# Patient Record
Sex: Male | Born: 1975 | Race: White | Hispanic: No | Marital: Married | Smoking: Never smoker
Health system: Southern US, Community
[De-identification: ages and names within clinical notes are randomized; demographics above are authoritative.]

---

## 2014-05-26 ENCOUNTER — Encounter (HOSPITAL_BASED_OUTPATIENT_CLINIC_OR_DEPARTMENT_OTHER): Payer: Self-pay | Admitting: Emergency Medicine

## 2014-05-26 ENCOUNTER — Emergency Department (HOSPITAL_BASED_OUTPATIENT_CLINIC_OR_DEPARTMENT_OTHER)
Admission: EM | Admit: 2014-05-26 | Discharge: 2014-05-26 | Disposition: A | Discharge status: Home / Self Care | Payer: Self-pay | Attending: Emergency Medicine | Admitting: Emergency Medicine

## 2014-05-26 DIAGNOSIS — IMO0002 Reserved for concepts with insufficient information to code with codable children: Secondary | ICD-10-CM | POA: Insufficient documentation

## 2014-05-26 DIAGNOSIS — L0291 Cutaneous abscess, unspecified: Secondary | ICD-10-CM

## 2014-05-26 DIAGNOSIS — L0390 Cellulitis, unspecified: Secondary | ICD-10-CM

## 2014-05-26 LAB — BASIC METABOLIC PANEL
ANION GAP: 11 (ref 5–15)
BUN: 13 mg/dL (ref 6–23)
CHLORIDE: 101 meq/L (ref 96–112)
CO2: 29 mEq/L (ref 19–32)
CREATININE: 1.1 mg/dL (ref 0.50–1.35)
Calcium: 9.7 mg/dL (ref 8.4–10.5)
GFR calc non Af Amer: 86 mL/min — ABNORMAL LOW (ref >90)
Glucose, Bld: 131 mg/dL — ABNORMAL HIGH (ref 70–99)
POTASSIUM: 4 meq/L (ref 3.7–5.3)
SODIUM: 141 meq/L (ref 137–147)

## 2014-05-26 LAB — CBC WITH DIFFERENTIAL/PLATELET
BASOS ABS: 0 10*3/uL (ref 0.0–0.1)
BASOS PCT: 0 % (ref 0–1)
Eosinophils Absolute: 0.2 10*3/uL (ref 0.0–0.7)
Eosinophils Relative: 3 % (ref 0–5)
HCT: 39.9 % (ref 39.0–52.0)
Hemoglobin: 13.7 g/dL (ref 13.0–17.0)
LYMPHS PCT: 29 % (ref 12–46)
Lymphs Abs: 2.4 10*3/uL (ref 0.7–4.0)
MCH: 29.7 pg (ref 26.0–34.0)
MCHC: 34.3 g/dL (ref 30.0–36.0)
MCV: 86.6 fL (ref 78.0–100.0)
Monocytes Absolute: 0.6 10*3/uL (ref 0.1–1.0)
Monocytes Relative: 7 % (ref 3–12)
NEUTROS ABS: 5.1 10*3/uL (ref 1.7–7.7)
NEUTROS PCT: 61 % (ref 43–77)
Platelets: 216 10*3/uL (ref 150–400)
RBC: 4.61 MIL/uL (ref 4.22–5.81)
RDW: 12.2 % (ref 11.5–15.5)
WBC: 8.3 10*3/uL (ref 4.0–10.5)

## 2014-05-26 MED ORDER — SODIUM CHLORIDE 0.9 % IV BOLUS (SEPSIS)
250.0000 mL | Freq: Once | INTRAVENOUS | Status: AC
  Administered 2014-05-26: 250 mL via INTRAVENOUS

## 2014-05-26 MED ORDER — SODIUM CHLORIDE 0.9 % IV SOLN: INTRAVENOUS | Status: DC

## 2014-05-26 MED ORDER — VANCOMYCIN HCL IN DEXTROSE 1-5 GM/200ML-% IV SOLN
1000.0000 mg | Freq: Once | INTRAVENOUS | Status: AC
  Administered 2014-05-26: 1000 mg via INTRAVENOUS
  Filled 2014-05-26: qty 200

## 2014-05-26 MED ORDER — DOXYCYCLINE HYCLATE 100 MG PO CAPS: 100.0000 mg | ORAL_CAPSULE | Freq: Two times a day (BID) | ORAL | Status: AC

## 2014-05-26 NOTE — ED Notes (Signed)
Pt has abscess on right forearm  Since Saturday. Pt had I&D at urgent care, sent for IV abx

## 2014-05-26 NOTE — Discharge Instructions (Signed)
Cellulitis Cellulitis is an infection of the skin and the tissue under the skin. The infected area is usually red and tender. This happens most often in the arms and lower legs. HOME CARE   Take your antibiotic medicine as told. Finish the medicine even if you start to feel better.  Keep the infected arm or leg raised (elevated).  Put a warm cloth on the area up to 4 times per day.  Only take medicines as told by your doctor.  Keep all doctor visits as told. GET HELP IF:  You see red streaks on the skin coming from the infected area.  Your red area gets bigger or turns a dark color.  Your bone or joint under the infected area is painful after the skin heals.  Your infection comes back in the same area or different area.  You have a puffy (swollen) bump in the infected area.  You have new symptoms.  You have a fever. GET HELP RIGHT AWAY IF:   You feel very sleepy.  You throw up (vomit) or have watery poop (diarrhea).  You feel sick and have muscle aches and pains. MAKE SURE YOU:   Understand these instructions.  Will watch your condition.  Will get help right away if you are not doing well or get worse. Document Released: 03/05/2008 Document Revised: 02/01/2014 Document Reviewed: 12/03/2011 [facility] Patient Information 2015 [place], [place]. This information is not intended to replace advice given to you by your health care provider. Make sure you discuss any questions you have with your health care provider.  Abscess An abscess (boil or furuncle) is an infected area on or under the skin. This area is filled with yellowish-white fluid (pus) and other material (debris). HOME CARE   Only take medicines as told by your doctor.  If you were given antibiotic medicine, take it as directed. Finish the medicine even if you start to feel better.  If gauze is used, follow your doctor's directions for changing the gauze.  To avoid spreading the infection:  Keep your  abscess covered with a bandage.  Wash your hands well.  Do not share personal care items, towels, or whirlpools with others.  Avoid skin contact with others.  Keep your skin and clothes clean around the abscess.  Keep all doctor visits as told. GET HELP RIGHT AWAY IF:   You have more pain, puffiness (swelling), or redness in the wound site.  You have more fluid or blood coming from the wound site.  You have muscle aches, chills, or you feel sick.  You have a fever. MAKE SURE YOU:   Understand these instructions.  Will watch your condition.  Will get help right away if you are not doing well or get worse. Document Released: 03/05/2008 Document Revised: 03/18/2012 Document Reviewed: 11/30/2011 [facility] Patient Information 2015 [place], [place]. This information is not intended to replace advice given to you by your health care provider. Make sure you discuss any questions you have with your health care provider. Take antibiotic starting tomorrow as directed. Take pain medicine as given to you by the urgent care. Elevate the arm is much as possible. Return for any newer worse symptoms. Should be improving over the next couple days. Work note provided.

## 2014-05-26 NOTE — ED Provider Notes (Signed)
CSN: [id number]     Arrival date & time 05/26/14  1738 History   This chart was scribed for Vanetta Mulders, MD by [place], ED Scribe. The patient was seen in [facility]. The patient's care was started at 7:24 PM.    Chief Complaint  Patient presents with  . Abscess      Patient is a 38 y.o. male presenting with abscess. The history is provided by the patient. No language interpreter was used.  Abscess Associated symptoms: fever   Associated symptoms: no headaches and no vomiting    HPI Comments: Gee Habig is a 38 y.o. male who presents to the Emergency Department complaining of abscess to proximal aspect of his right forearm onset Saturday that pt believes may be possible from coming in contact with poison ivy. Pt also complains of associated fever and chills. Pt was seen at the [facility] earlier this afternoon around 5:00PM where he had I&D performed. Pt denies nausea, vomiting or diarrhea.    History reviewed. No pertinent past medical history. History reviewed. No pertinent past surgical history. No family history on file. History  Substance Use Topics  . Smoking status: Never Smoker   . Smokeless tobacco: Not on file  . Alcohol Use: Yes     Comment: occ    Review of Systems  Constitutional: Positive for fever and chills.  HENT: Negative for rhinorrhea and sore throat.   Eyes: Negative for visual disturbance.  Respiratory: Negative for cough and shortness of breath.   Cardiovascular: Negative for chest pain.  Gastrointestinal: Negative for vomiting, abdominal pain, diarrhea and rectal pain.  Genitourinary: Negative for dysuria and hematuria.  Musculoskeletal: Negative for back pain and neck pain.  Skin: Positive for rash (abscess).  Neurological: Negative for numbness and headaches.  Hematological: Does not bruise/bleed easily.  Psychiatric/Behavioral: Negative for confusion.      Allergies  Review of patient's allergies indicates no known  allergies.  Home Medications   Prior to Admission medications   Medication Sig Start Date End Date Taking? Authorizing Provider  doxycycline (VIBRAMYCIN) 100 MG capsule Take 1 capsule (100 mg total) by mouth 2 (two) times daily. 05/26/14   Vanetta Mulders, MD   BP 135/91  Pulse 83  Temp(Src) 99 F (37.2 C) (Oral)  Resp 16  Ht  (1.803 m)  Wt 265 lb (120.203 kg)  BMI 36.98 kg/m2  SpO2 100% Physical Exam  Nursing note and vitals reviewed. Constitutional: He is oriented to person, place, and time. He appears well-developed and well-nourished. No distress.  HENT:  Head: Normocephalic and atraumatic.  Mouth/Throat: Oropharynx is clear and moist.  Eyes: Conjunctivae and EOM are normal. No scleral icterus.  Neck: Normal range of motion.  Cardiovascular: Normal rate, regular rhythm and normal heart sounds.   No murmur heard. Cap refill 2 seconds.  RP 2+  Pulmonary/Chest: Effort normal and breath sounds normal. No respiratory distress.  Abdominal: Soft. Bowel sounds are normal. There is no tenderness.  Musculoskeletal: Normal range of motion.  Neurological: He is alert and oriented to person, place, and time. No cranial nerve deficit. He exhibits normal muscle tone. Coordination normal.  Skin: There is erythema.  Area of redness- 11cm  Area of induration- 5cm Incision- 2cm    ED Course  Procedures (including critical care time) Labs Review Labs Reviewed  BASIC METABOLIC PANEL - Abnormal; Notable for the following:    Glucose, Bld 131 (*)    GFR calc non Af Amer 86 (*)  All other components within normal limits  CBC WITH DIFFERENTIAL    Results for orders placed during the hospital encounter of 05/26/14  CBC WITH DIFFERENTIAL      Result Value Ref Range   WBC 8.3  4.0 - 10.5 K/uL   RBC 4.61  4.22 - 5.81 MIL/uL   Hemoglobin 13.7  13.0 - 17.0 g/dL   HCT 40.9  81.1 - 91.4 %   MCV 86.6  78.0 - 100.0 fL   MCH 29.7  26.0 - 34.0 pg   MCHC 34.3  30.0 - 36.0 g/dL   RDW  78.2  95.6 - 21.3 %   Platelets 216  150 - 400 K/uL   Neutrophils Relative % 61  43 - 77 %   Neutro Abs 5.1  1.7 - 7.7 K/uL   Lymphocytes Relative 29  12 - 46 %   Lymphs Abs 2.4  0.7 - 4.0 K/uL   Monocytes Relative 7  3 - 12 %   Monocytes Absolute 0.6  0.1 - 1.0 K/uL   Eosinophils Relative 3  0 - 5 %   Eosinophils Absolute 0.2  0.0 - 0.7 K/uL   Basophils Relative 0  0 - 1 %   Basophils Absolute 0.0  0.0 - 0.1 K/uL  BASIC METABOLIC PANEL      Result Value Ref Range   Sodium 141  137 - 147 mEq/L   Potassium 4.0  3.7 - 5.3 mEq/L   Chloride 101  96 - 112 mEq/L   CO2 29  19 - 32 mEq/L   Glucose, Bld 131 (*) 70 - 99 mg/dL   BUN 13  6 - 23 mg/dL   Creatinine, Ser 0.86  0.50 - 1.35 mg/dL   Calcium 9.7  8.4 - 57.8 mg/dL   GFR calc non Af Amer 86 (*) >90 mL/min   GFR calc Af Amer >90  >90 mL/min   Anion gap 11  5 - 15   No results found.    Imaging Review No results found.   EKG Interpretation None     Medications  0.9 %  sodium chloride infusion (not administered)  sodium chloride 0.9 % bolus 250 mL (250 mLs Intravenous New Bag/Given 05/26/14 1953)  vancomycin (VANCOCIN) IVPB 1000 mg/200 mL premix (0 mg Intravenous Stopped 05/26/14 2123)    7:31 PM- Treatment plan was discussed with patient who verbalizes understanding and agrees.  MDM   Final diagnoses:  Cellulitis and abscess    Patient with cellulitis to the right elbow right proximal forearm. Patient had incision and drainage of an abscess skin abscess at an urgent care earlier today. Referred here for IV antibiotics. Will change patient from the clindamycin to doxycycline orally. Also patient given IV vancomycin here. Patient nontoxic no acute distress. No significant leukocytosis no significant electrolyte abnormalities. Cautions provided. Patient should have improvement over the next couple days.  I personally performed the services described in this documentation, which was scribed in my presence. The recorded  information has been reviewed and is accurate.     Vanetta Mulders, MD 05/26/14 2124

## 2017-01-13 ENCOUNTER — Emergency Department (HOSPITAL_COMMUNITY)
Admission: EM | Admit: 2017-01-13 | Discharge: 2017-01-14 | Disposition: A | Discharge status: Home / Self Care | Payer: Managed Care, Other (non HMO) | Attending: Emergency Medicine | Admitting: Emergency Medicine

## 2017-01-13 ENCOUNTER — Encounter (HOSPITAL_COMMUNITY): Payer: Self-pay | Admitting: Emergency Medicine

## 2017-01-13 DIAGNOSIS — Y92009 Unspecified place in unspecified non-institutional (private) residence as the place of occurrence of the external cause: Secondary | ICD-10-CM | POA: Insufficient documentation

## 2017-01-13 DIAGNOSIS — S76112A Strain of left quadriceps muscle, fascia and tendon, initial encounter: Secondary | ICD-10-CM | POA: Diagnosis not present

## 2017-01-13 DIAGNOSIS — Y999 Unspecified external cause status: Secondary | ICD-10-CM | POA: Insufficient documentation

## 2017-01-13 DIAGNOSIS — Z79899 Other long term (current) drug therapy: Secondary | ICD-10-CM | POA: Insufficient documentation

## 2017-01-13 DIAGNOSIS — S79922A Unspecified injury of left thigh, initial encounter: Secondary | ICD-10-CM | POA: Diagnosis present

## 2017-01-13 DIAGNOSIS — Y939 Activity, unspecified: Secondary | ICD-10-CM | POA: Insufficient documentation

## 2017-01-13 DIAGNOSIS — W1839XA Other fall on same level, initial encounter: Secondary | ICD-10-CM | POA: Diagnosis not present

## 2017-01-13 NOTE — ED Notes (Signed)
Bed: WG95 Expected date:  Expected time:  Means of arrival:  Comments: Left leg injury

## 2017-01-13 NOTE — ED Triage Notes (Signed)
Brought in by EMS from home with c/o left upper leg pain after his mechanical fall tonight.  Pt reported that he slipped in the garage and fell backwards--- did not hit head, no loss of consciousness.  Pt has had immediate severe pain to left upper leg after the fall, with limited movement, tenderness and swelling to the affected area--- was given Fentanyl 100 mcg IV and immobilized on scene.

## 2017-01-14 ENCOUNTER — Emergency Department (HOSPITAL_COMMUNITY): Payer: Managed Care, Other (non HMO)

## 2017-01-14 MED ORDER — OXYCODONE-ACETAMINOPHEN 5-325 MG PO TABS
2.0000 | ORAL_TABLET | Freq: Once | ORAL | Status: AC
  Administered 2017-01-14: 2 via ORAL
  Filled 2017-01-14: qty 2

## 2017-01-14 MED ORDER — MORPHINE SULFATE (PF) 4 MG/ML IV SOLN
4.0000 mg | Freq: Once | INTRAVENOUS | Status: AC
  Administered 2017-01-14: 4 mg via INTRAVENOUS
  Filled 2017-01-14: qty 1

## 2017-01-14 MED ORDER — DIAZEPAM 5 MG PO TABS: 5.0000 mg | ORAL_TABLET | Freq: Three times a day (TID) | ORAL | 0 refills | Status: AC | PRN

## 2017-01-14 MED ORDER — OXYCODONE-ACETAMINOPHEN 5-325 MG PO TABS: 1.0000 | ORAL_TABLET | Freq: Four times a day (QID) | ORAL | 0 refills | Status: AC | PRN

## 2017-01-14 NOTE — ED Provider Notes (Signed)
[facility]-EMERGENCY DEPT Provider Note   CSN: [id number] Arrival date & time: 01/13/17  2311     History   Chief Complaint Chief Complaint  Patient presents with  . Fall  . Leg Injury    HPI Nicholas Wilcox is a 41 y.o. male.  41 year old male who presents with left thigh pain. This evening the patient was walking in his house when he slipped and fell. He does not recall exactly how he injured his left leg but he had an immediate onset of severe pain in his left thigh. He did not strike his head or lose consciousness. He initially crawled to a recliner and was not going to come to the ED but his leg later fell off the recliner causing severe pain. His pain is worse with any kind of movement at the knee. He denies any other injuries from the fall. Pain was mildly improved after fentanyl by EMS but he states that this has worn off.   The history is provided by the patient.  Fall     History reviewed. No pertinent past medical history.  There are no active problems to display for this patient.   History reviewed. No pertinent surgical history.     Home Medications    Prior to Admission medications   Medication Sig Start Date End Date Taking? Authorizing Provider  doxycycline (VIBRAMYCIN) 100 MG capsule Take 1 capsule (100 mg total) by mouth 2 (two) times daily. 05/26/14   Vanetta Mulders, MD    Family History History reviewed. No pertinent family history.  Social History Social History  Substance Use Topics  . Smoking status: Never Smoker  . Smokeless tobacco: Never Used  . Alcohol use Yes     Comment: occ     Allergies   Patient has no known allergies.   Review of Systems Review of Systems  All other systems reviewed and are negative.    Physical Exam Updated Vital Signs BP 133/82 (BP Location: Left Arm)   Pulse 86   Temp 98.7 F (37.1 C) (Oral)   Resp 20   SpO2 96%   Physical Exam  Constitutional: He is oriented to person, place, and time. He appears  well-developed and well-nourished. No distress.  HENT:  Head: Normocephalic and atraumatic.  Eyes: Conjunctivae are normal.  Neck: Neck supple.  Cardiovascular: Normal rate, regular rhythm, normal heart sounds and intact distal pulses.   Pulmonary/Chest: Effort normal and breath sounds normal.  Musculoskeletal: He exhibits edema and tenderness.  Tenderness of left anterior mid-thigh to just above knee with irregularity at distal insertion of quadriceps tendon; no lower leg or ankle tenderness; mild tenderness of proximal dorsal L foot without deformity Unable to extend leg at knee against gravity  Neurological: He is alert and oriented to person, place, and time. No sensory deficit.  Skin: Skin is warm and dry.  Psychiatric: He has a normal mood and affect. Judgment normal.  Nursing note and vitals reviewed.    ED Treatments / Results  Labs (all labs ordered are listed, but only abnormal results are displayed) Labs Reviewed - No data to display  EKG  EKG Interpretation None       Radiology Dg Knee Complete 4 Views Left  Result Date: 01/14/2017 CLINICAL DATA:  Left upper leg pain after mechanical fall tonight. Slip and fall injury. No loss of consciousness. Limited movement, tenderness, and swelling to the left upper leg. EXAM: LEFT KNEE - COMPLETE 4+ VIEW COMPARISON:  None. FINDINGS: No evidence of  fracture, dislocation, or joint effusion. No evidence of arthropathy or other focal bone abnormality. Soft tissues are unremarkable. IMPRESSION: Negative. Electronically Signed   By: Burman Nieves M.D.   On: 01/14/2017 00:56   Dg Foot Complete Left  Result Date: 01/14/2017 CLINICAL DATA:  Left upper leg pain after mechanical fall tonight. Slip and fall injury. No loss of consciousness. Limited movement, tenderness, and swelling to the left upper leg. EXAM: LEFT FOOT - COMPLETE 3+ VIEW COMPARISON:  None. FINDINGS: There is no evidence of fracture or dislocation. There is no evidence  of arthropathy or other focal bone abnormality. Soft tissues are unremarkable. IMPRESSION: Negative. Electronically Signed   By: Burman Nieves M.D.   On: 01/14/2017 00:57   Dg Femur Min 2 Views Left  Result Date: 01/14/2017 CLINICAL DATA:  Left upper leg pain after mechanical fall tonight. Slip and fall injury. No loss of consciousness. Limited movement, tenderness, and swelling to the left upper leg. EXAM: LEFT FEMUR 2 VIEWS COMPARISON:  None. FINDINGS: There is no evidence of fracture or other focal bone lesions. Soft tissues are unremarkable. IMPRESSION: Negative. Electronically Signed   By: Burman Nieves M.D.   On: 01/14/2017 00:56    Procedures Procedures (including critical care time)  Medications Ordered in ED Medications  morphine 4 MG/ML injection 4 mg (4 mg Intravenous Given 01/14/17 0004)  oxyCODONE-acetaminophen (PERCOCET/ROXICET) 5-325 MG per tablet 2 tablet (2 tablets Oral Given 01/14/17 0129)     Initial Impression / Assessment and Plan / ED Course  I have reviewed the triage vital signs and the nursing notes.  Pertinent imaging results that were available during my care of the patient were reviewed by me and considered in my medical decision making (see chart for details).     Pt w/ L upper leg pain after fall at home. He was neurovascularly intact distally with tenderness of anterior mid thigh and pain at distal insertion of quadriceps tendon. Unable to extend leg at knee against gravity. Obtained plain films which are negative for fracture. I am suspicious of quadriceps tendon rupture. This patient in the immobilizer and instructed to be strict nonweightbearing with crutches until follow-up with orthopedics this week. Discussed supportive measures and pain control. reviewed return precautions and patient discharged in satisfactory condition.  Final Clinical Impressions(s) / ED Diagnoses   Final diagnoses:  None    New Prescriptions New Prescriptions   No  medications on file     Laurence Spates, MD 01/14/17 [id number]

## 2018-06-26 DIAGNOSIS — F4312 Post-traumatic stress disorder, chronic: Secondary | ICD-10-CM | POA: Diagnosis not present

## 2018-07-03 DIAGNOSIS — F4312 Post-traumatic stress disorder, chronic: Secondary | ICD-10-CM | POA: Diagnosis not present

## 2018-07-10 DIAGNOSIS — F4312 Post-traumatic stress disorder, chronic: Secondary | ICD-10-CM | POA: Diagnosis not present

## 2018-07-31 DIAGNOSIS — F4312 Post-traumatic stress disorder, chronic: Secondary | ICD-10-CM | POA: Diagnosis not present

## 2018-08-14 DIAGNOSIS — F4312 Post-traumatic stress disorder, chronic: Secondary | ICD-10-CM | POA: Diagnosis not present

## 2018-09-04 DIAGNOSIS — F4312 Post-traumatic stress disorder, chronic: Secondary | ICD-10-CM | POA: Diagnosis not present

## 2019-03-26 DIAGNOSIS — E7800 Pure hypercholesterolemia, unspecified: Secondary | ICD-10-CM | POA: Diagnosis not present

## 2019-03-26 DIAGNOSIS — Z0000 Encounter for general adult medical examination without abnormal findings: Secondary | ICD-10-CM | POA: Diagnosis not present

## 2019-06-23 DIAGNOSIS — Z23 Encounter for immunization: Secondary | ICD-10-CM | POA: Diagnosis not present

## 2020-04-20 DIAGNOSIS — Z0000 Encounter for general adult medical examination without abnormal findings: Secondary | ICD-10-CM | POA: Diagnosis not present

## 2020-04-20 DIAGNOSIS — E7800 Pure hypercholesterolemia, unspecified: Secondary | ICD-10-CM | POA: Diagnosis not present

## 2020-06-30 DIAGNOSIS — Z23 Encounter for immunization: Secondary | ICD-10-CM | POA: Diagnosis not present

## 2020-07-28 DIAGNOSIS — F419 Anxiety disorder, unspecified: Secondary | ICD-10-CM | POA: Diagnosis not present

## 2020-08-02 DIAGNOSIS — F419 Anxiety disorder, unspecified: Secondary | ICD-10-CM | POA: Diagnosis not present

## 2020-08-10 DIAGNOSIS — F419 Anxiety disorder, unspecified: Secondary | ICD-10-CM | POA: Diagnosis not present

## 2020-08-17 DIAGNOSIS — F419 Anxiety disorder, unspecified: Secondary | ICD-10-CM | POA: Diagnosis not present

## 2020-08-31 DIAGNOSIS — F419 Anxiety disorder, unspecified: Secondary | ICD-10-CM | POA: Diagnosis not present

## 2020-09-07 DIAGNOSIS — F419 Anxiety disorder, unspecified: Secondary | ICD-10-CM | POA: Diagnosis not present

## 2020-09-14 DIAGNOSIS — F419 Anxiety disorder, unspecified: Secondary | ICD-10-CM | POA: Diagnosis not present

## 2020-10-04 DIAGNOSIS — F419 Anxiety disorder, unspecified: Secondary | ICD-10-CM | POA: Diagnosis not present

## 2020-10-18 DIAGNOSIS — F419 Anxiety disorder, unspecified: Secondary | ICD-10-CM | POA: Diagnosis not present

## 2020-11-01 DIAGNOSIS — F419 Anxiety disorder, unspecified: Secondary | ICD-10-CM | POA: Diagnosis not present

## 2020-11-15 DIAGNOSIS — F419 Anxiety disorder, unspecified: Secondary | ICD-10-CM | POA: Diagnosis not present

## 2020-11-29 DIAGNOSIS — F419 Anxiety disorder, unspecified: Secondary | ICD-10-CM | POA: Diagnosis not present

## 2020-12-14 DIAGNOSIS — F419 Anxiety disorder, unspecified: Secondary | ICD-10-CM | POA: Diagnosis not present

## 2020-12-28 DIAGNOSIS — F419 Anxiety disorder, unspecified: Secondary | ICD-10-CM | POA: Diagnosis not present

## 2021-01-10 DIAGNOSIS — F419 Anxiety disorder, unspecified: Secondary | ICD-10-CM | POA: Diagnosis not present

## 2021-01-25 DIAGNOSIS — F419 Anxiety disorder, unspecified: Secondary | ICD-10-CM | POA: Diagnosis not present

## 2021-02-08 DIAGNOSIS — F419 Anxiety disorder, unspecified: Secondary | ICD-10-CM | POA: Diagnosis not present

## 2021-03-22 DIAGNOSIS — F419 Anxiety disorder, unspecified: Secondary | ICD-10-CM | POA: Diagnosis not present

## 2021-04-21 DIAGNOSIS — Z0000 Encounter for general adult medical examination without abnormal findings: Secondary | ICD-10-CM | POA: Diagnosis not present

## 2021-05-17 DIAGNOSIS — F419 Anxiety disorder, unspecified: Secondary | ICD-10-CM | POA: Diagnosis not present

## 2021-05-19 DIAGNOSIS — L501 Idiopathic urticaria: Secondary | ICD-10-CM | POA: Diagnosis not present

## 2021-05-22 DIAGNOSIS — J3089 Other allergic rhinitis: Secondary | ICD-10-CM | POA: Diagnosis not present

## 2021-05-22 DIAGNOSIS — T781XXD Other adverse food reactions, not elsewhere classified, subsequent encounter: Secondary | ICD-10-CM | POA: Diagnosis not present

## 2021-05-22 DIAGNOSIS — R0602 Shortness of breath: Secondary | ICD-10-CM | POA: Diagnosis not present

## 2021-05-22 DIAGNOSIS — J301 Allergic rhinitis due to pollen: Secondary | ICD-10-CM | POA: Diagnosis not present

## 2021-06-21 DIAGNOSIS — F419 Anxiety disorder, unspecified: Secondary | ICD-10-CM | POA: Diagnosis not present

## 2021-07-27 DIAGNOSIS — F419 Anxiety disorder, unspecified: Secondary | ICD-10-CM | POA: Diagnosis not present

## 2021-07-28 DIAGNOSIS — E7800 Pure hypercholesterolemia, unspecified: Secondary | ICD-10-CM | POA: Diagnosis not present

## 2021-08-16 DIAGNOSIS — F419 Anxiety disorder, unspecified: Secondary | ICD-10-CM | POA: Diagnosis not present

## 2021-09-13 DIAGNOSIS — F419 Anxiety disorder, unspecified: Secondary | ICD-10-CM | POA: Diagnosis not present

## 2021-10-11 DIAGNOSIS — F419 Anxiety disorder, unspecified: Secondary | ICD-10-CM | POA: Diagnosis not present

## 2021-11-15 DIAGNOSIS — F419 Anxiety disorder, unspecified: Secondary | ICD-10-CM | POA: Diagnosis not present

## 2021-12-19 DIAGNOSIS — J301 Allergic rhinitis due to pollen: Secondary | ICD-10-CM | POA: Diagnosis not present

## 2021-12-19 DIAGNOSIS — R0602 Shortness of breath: Secondary | ICD-10-CM | POA: Diagnosis not present

## 2021-12-19 DIAGNOSIS — T7800XD Anaphylactic reaction due to unspecified food, subsequent encounter: Secondary | ICD-10-CM | POA: Diagnosis not present

## 2021-12-19 DIAGNOSIS — J3089 Other allergic rhinitis: Secondary | ICD-10-CM | POA: Diagnosis not present

## 2021-12-20 DIAGNOSIS — F419 Anxiety disorder, unspecified: Secondary | ICD-10-CM | POA: Diagnosis not present

## 2022-01-24 ENCOUNTER — Ambulatory Visit
Admission: RE | Admit: 2022-01-24 | Discharge: 2022-01-24 | Disposition: A | Discharge status: Home / Self Care | Payer: BC Managed Care – PPO | Source: Ambulatory Visit | Attending: Family Medicine | Admitting: Family Medicine

## 2022-01-24 ENCOUNTER — Other Ambulatory Visit: Payer: Self-pay | Admitting: Family Medicine

## 2022-01-24 DIAGNOSIS — M19071 Primary osteoarthritis, right ankle and foot: Secondary | ICD-10-CM | POA: Diagnosis not present

## 2022-01-24 DIAGNOSIS — M79674 Pain in right toe(s): Secondary | ICD-10-CM

## 2022-01-24 DIAGNOSIS — M25561 Pain in right knee: Secondary | ICD-10-CM | POA: Diagnosis not present

## 2022-01-31 DIAGNOSIS — F419 Anxiety disorder, unspecified: Secondary | ICD-10-CM | POA: Diagnosis not present

## 2022-02-02 DIAGNOSIS — M25561 Pain in right knee: Secondary | ICD-10-CM | POA: Diagnosis not present

## 2022-02-07 DIAGNOSIS — M62551 Muscle wasting and atrophy, not elsewhere classified, right thigh: Secondary | ICD-10-CM | POA: Diagnosis not present

## 2022-02-07 DIAGNOSIS — R269 Unspecified abnormalities of gait and mobility: Secondary | ICD-10-CM | POA: Diagnosis not present

## 2022-02-07 DIAGNOSIS — M25561 Pain in right knee: Secondary | ICD-10-CM | POA: Diagnosis not present

## 2022-02-09 DIAGNOSIS — M25561 Pain in right knee: Secondary | ICD-10-CM | POA: Diagnosis not present

## 2022-02-09 DIAGNOSIS — R269 Unspecified abnormalities of gait and mobility: Secondary | ICD-10-CM | POA: Diagnosis not present

## 2022-02-09 DIAGNOSIS — M62551 Muscle wasting and atrophy, not elsewhere classified, right thigh: Secondary | ICD-10-CM | POA: Diagnosis not present

## 2022-02-14 DIAGNOSIS — M25561 Pain in right knee: Secondary | ICD-10-CM | POA: Diagnosis not present

## 2022-02-14 DIAGNOSIS — R269 Unspecified abnormalities of gait and mobility: Secondary | ICD-10-CM | POA: Diagnosis not present

## 2022-02-14 DIAGNOSIS — M62551 Muscle wasting and atrophy, not elsewhere classified, right thigh: Secondary | ICD-10-CM | POA: Diagnosis not present

## 2022-02-16 DIAGNOSIS — R269 Unspecified abnormalities of gait and mobility: Secondary | ICD-10-CM | POA: Diagnosis not present

## 2022-02-16 DIAGNOSIS — M25561 Pain in right knee: Secondary | ICD-10-CM | POA: Diagnosis not present

## 2022-02-16 DIAGNOSIS — M62551 Muscle wasting and atrophy, not elsewhere classified, right thigh: Secondary | ICD-10-CM | POA: Diagnosis not present

## 2022-02-21 DIAGNOSIS — M25561 Pain in right knee: Secondary | ICD-10-CM | POA: Diagnosis not present

## 2022-02-21 DIAGNOSIS — R269 Unspecified abnormalities of gait and mobility: Secondary | ICD-10-CM | POA: Diagnosis not present

## 2022-02-21 DIAGNOSIS — M62551 Muscle wasting and atrophy, not elsewhere classified, right thigh: Secondary | ICD-10-CM | POA: Diagnosis not present

## 2022-02-22 DIAGNOSIS — M25561 Pain in right knee: Secondary | ICD-10-CM | POA: Diagnosis not present

## 2022-02-22 DIAGNOSIS — M25521 Pain in right elbow: Secondary | ICD-10-CM | POA: Diagnosis not present

## 2022-03-14 DIAGNOSIS — F419 Anxiety disorder, unspecified: Secondary | ICD-10-CM | POA: Diagnosis not present

## 2022-04-25 DIAGNOSIS — F419 Anxiety disorder, unspecified: Secondary | ICD-10-CM | POA: Diagnosis not present

## 2022-06-06 DIAGNOSIS — F419 Anxiety disorder, unspecified: Secondary | ICD-10-CM | POA: Diagnosis not present

## 2022-07-06 DIAGNOSIS — R21 Rash and other nonspecific skin eruption: Secondary | ICD-10-CM | POA: Diagnosis not present

## 2022-07-06 DIAGNOSIS — R109 Unspecified abdominal pain: Secondary | ICD-10-CM | POA: Diagnosis not present

## 2022-07-06 DIAGNOSIS — Z0189 Encounter for other specified special examinations: Secondary | ICD-10-CM | POA: Diagnosis not present

## 2022-07-06 DIAGNOSIS — F1010 Alcohol abuse, uncomplicated: Secondary | ICD-10-CM | POA: Diagnosis not present

## 2022-07-06 DIAGNOSIS — E7800 Pure hypercholesterolemia, unspecified: Secondary | ICD-10-CM | POA: Diagnosis not present

## 2022-07-11 DIAGNOSIS — F419 Anxiety disorder, unspecified: Secondary | ICD-10-CM | POA: Diagnosis not present

## 2022-08-15 DIAGNOSIS — F419 Anxiety disorder, unspecified: Secondary | ICD-10-CM | POA: Diagnosis not present

## 2022-09-12 DIAGNOSIS — F419 Anxiety disorder, unspecified: Secondary | ICD-10-CM | POA: Diagnosis not present

## 2022-11-07 DIAGNOSIS — F419 Anxiety disorder, unspecified: Secondary | ICD-10-CM | POA: Diagnosis not present

## 2022-12-12 DIAGNOSIS — F419 Anxiety disorder, unspecified: Secondary | ICD-10-CM | POA: Diagnosis not present

## 2022-12-18 DIAGNOSIS — J3089 Other allergic rhinitis: Secondary | ICD-10-CM | POA: Diagnosis not present

## 2022-12-18 DIAGNOSIS — R0602 Shortness of breath: Secondary | ICD-10-CM | POA: Diagnosis not present

## 2022-12-18 DIAGNOSIS — T7800XD Anaphylactic reaction due to unspecified food, subsequent encounter: Secondary | ICD-10-CM | POA: Diagnosis not present

## 2022-12-18 DIAGNOSIS — J301 Allergic rhinitis due to pollen: Secondary | ICD-10-CM | POA: Diagnosis not present

## 2023-01-23 DIAGNOSIS — F419 Anxiety disorder, unspecified: Secondary | ICD-10-CM | POA: Diagnosis not present

## 2023-03-20 DIAGNOSIS — F419 Anxiety disorder, unspecified: Secondary | ICD-10-CM | POA: Diagnosis not present

## 2023-04-24 DIAGNOSIS — F419 Anxiety disorder, unspecified: Secondary | ICD-10-CM | POA: Diagnosis not present

## 2023-06-05 DIAGNOSIS — F419 Anxiety disorder, unspecified: Secondary | ICD-10-CM | POA: Diagnosis not present

## 2023-07-12 DIAGNOSIS — E7800 Pure hypercholesterolemia, unspecified: Secondary | ICD-10-CM | POA: Diagnosis not present

## 2023-07-12 DIAGNOSIS — Z0000 Encounter for general adult medical examination without abnormal findings: Secondary | ICD-10-CM | POA: Diagnosis not present

## 2023-07-12 DIAGNOSIS — R7401 Elevation of levels of liver transaminase levels: Secondary | ICD-10-CM | POA: Diagnosis not present

## 2023-07-16 ENCOUNTER — Other Ambulatory Visit: Payer: Self-pay | Admitting: Family Medicine

## 2023-07-16 DIAGNOSIS — R7401 Elevation of levels of liver transaminase levels: Secondary | ICD-10-CM

## 2023-07-17 DIAGNOSIS — F419 Anxiety disorder, unspecified: Secondary | ICD-10-CM | POA: Diagnosis not present

## 2023-07-23 ENCOUNTER — Inpatient Hospital Stay
Admission: RE | Admit: 2023-07-23 | Discharge: 2023-07-23 | Discharge status: Home / Self Care | Payer: BC Managed Care – PPO | Source: Ambulatory Visit | Attending: Family Medicine | Admitting: Family Medicine

## 2023-07-23 DIAGNOSIS — K769 Liver disease, unspecified: Secondary | ICD-10-CM | POA: Diagnosis not present

## 2023-07-23 DIAGNOSIS — R7401 Elevation of levels of liver transaminase levels: Secondary | ICD-10-CM

## 2023-08-21 DIAGNOSIS — F419 Anxiety disorder, unspecified: Secondary | ICD-10-CM | POA: Diagnosis not present

## 2023-10-09 DIAGNOSIS — F419 Anxiety disorder, unspecified: Secondary | ICD-10-CM | POA: Diagnosis not present

## 2023-12-04 DIAGNOSIS — F419 Anxiety disorder, unspecified: Secondary | ICD-10-CM | POA: Diagnosis not present

## 2023-12-18 DIAGNOSIS — R0602 Shortness of breath: Secondary | ICD-10-CM | POA: Diagnosis not present

## 2023-12-18 DIAGNOSIS — J301 Allergic rhinitis due to pollen: Secondary | ICD-10-CM | POA: Diagnosis not present

## 2023-12-18 DIAGNOSIS — J3089 Other allergic rhinitis: Secondary | ICD-10-CM | POA: Diagnosis not present

## 2023-12-18 DIAGNOSIS — T7800XD Anaphylactic reaction due to unspecified food, subsequent encounter: Secondary | ICD-10-CM | POA: Diagnosis not present

## 2023-12-30 DIAGNOSIS — L2089 Other atopic dermatitis: Secondary | ICD-10-CM | POA: Diagnosis not present

## 2024-01-22 DIAGNOSIS — F419 Anxiety disorder, unspecified: Secondary | ICD-10-CM | POA: Diagnosis not present

## 2024-02-21 IMAGING — DX DG KNEE 1-2V*R*
2 series · 2 of 2 positions shown · non-contrast
Comparison: None.

CLINICAL DATA: Right knee pain

EXAM:
RIGHT KNEE - 1-2 VIEW

[dg knee 1-2 views right (1 of 2)]
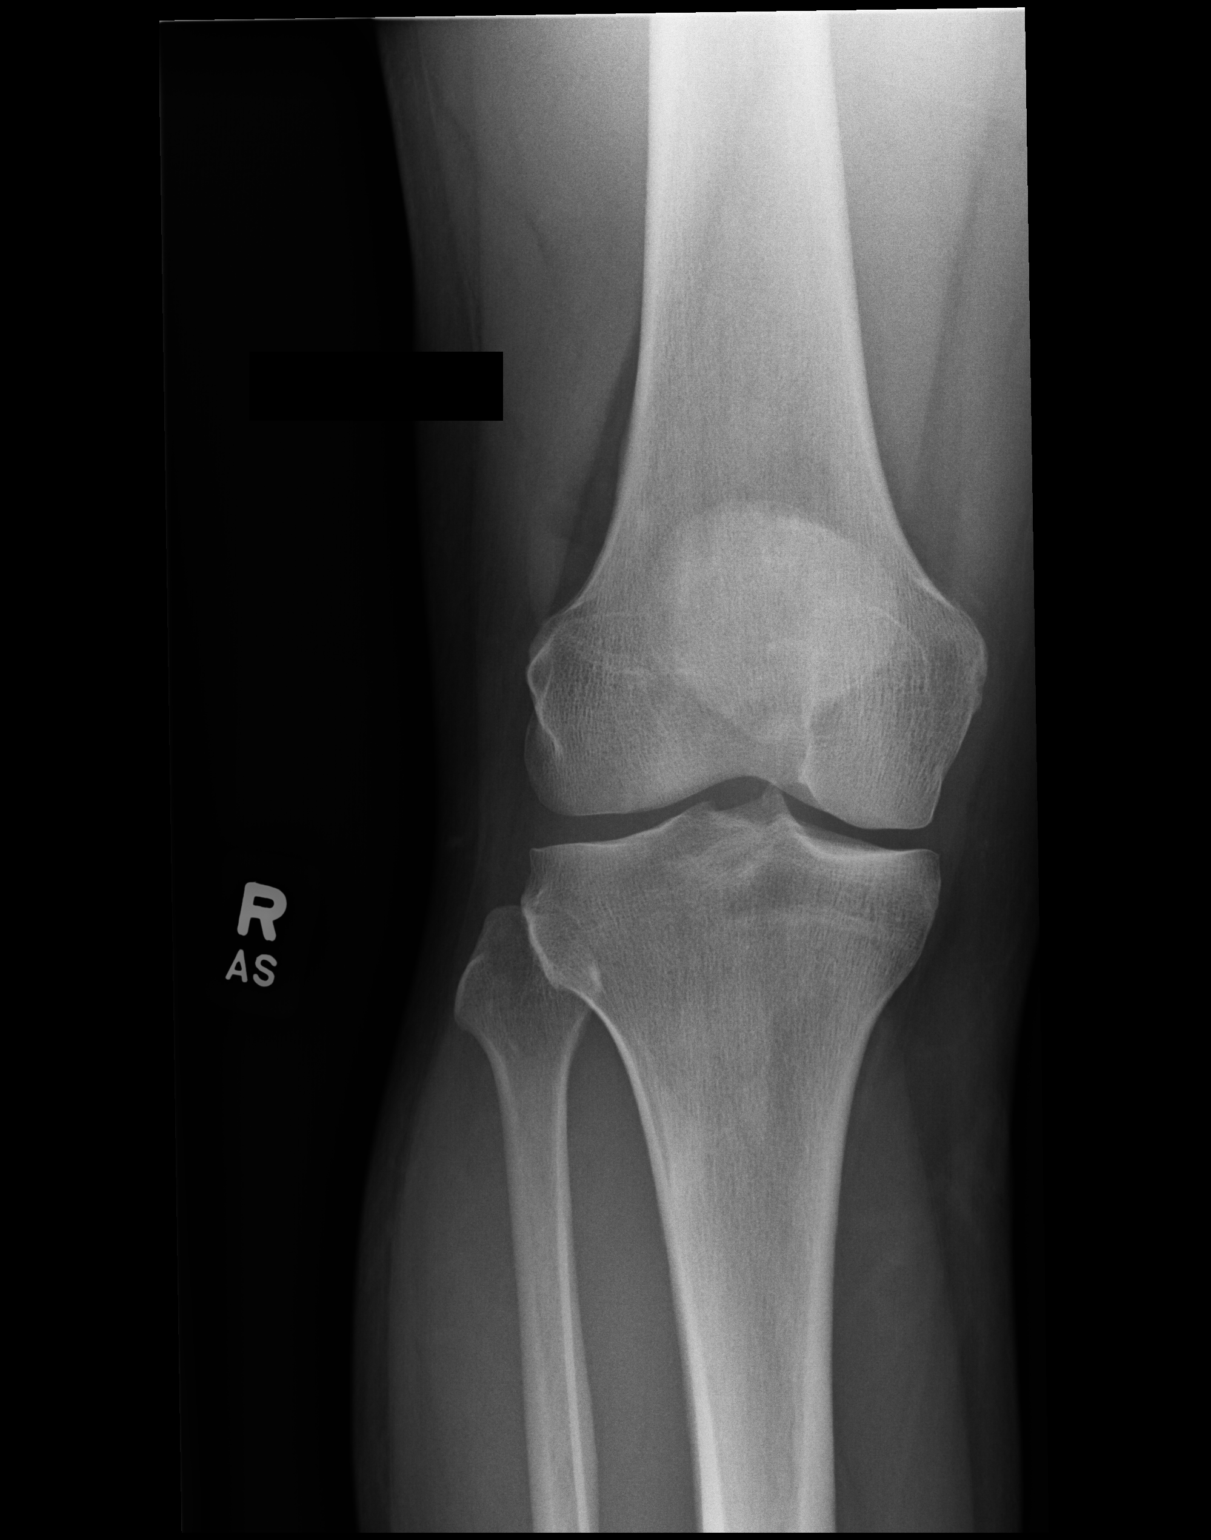

[dg knee 1-2 views right (2 of 2)]
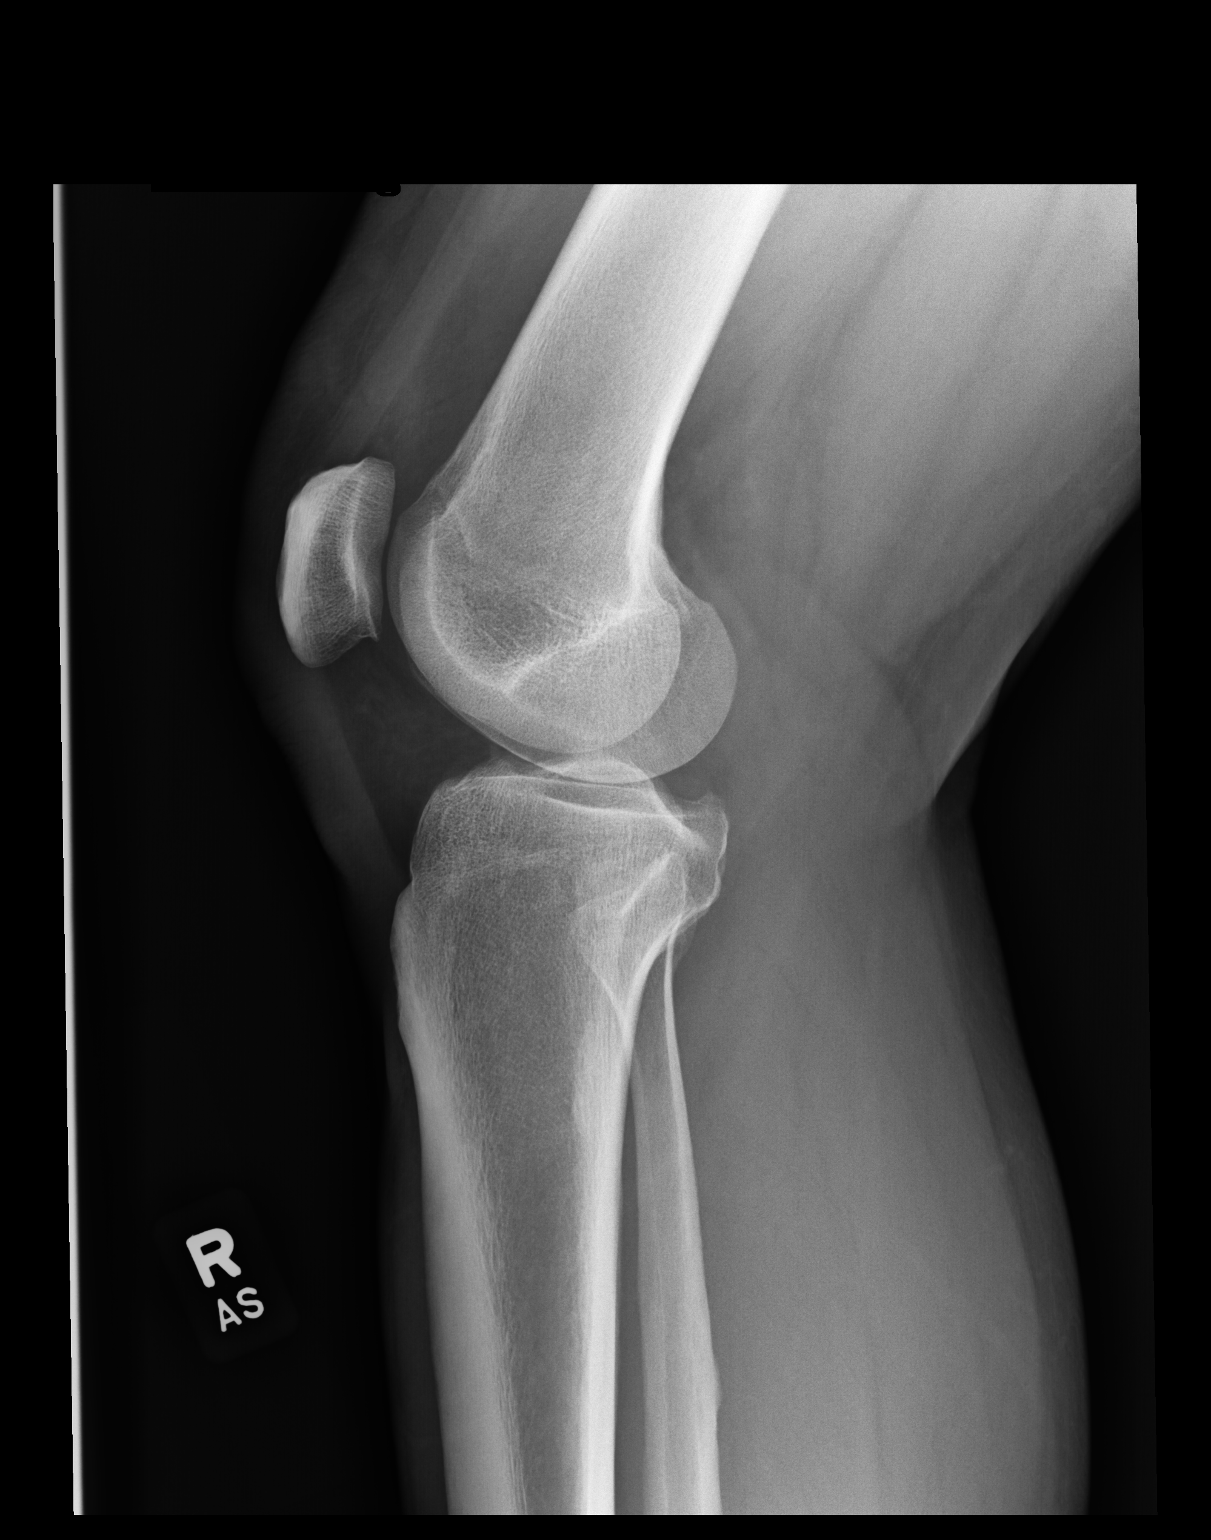

[2 of 2 positions shown; findings below may reference images not displayed]

FINDINGS: No acute fracture or dislocation identified. Joint spaces are
preserved. No joint effusion identified.
IMPRESSION: No acute osseous abnormality identified.

## 2024-02-21 IMAGING — DX DG TOE GREAT 2+V*R*
3 series · 3 of 3 positions shown · non-contrast
Comparison: None available

CLINICAL DATA: Right great toe pain.  No recent injury.

EXAM:
RIGHT GREAT TOE

[dg toe great right (1 of 3)]
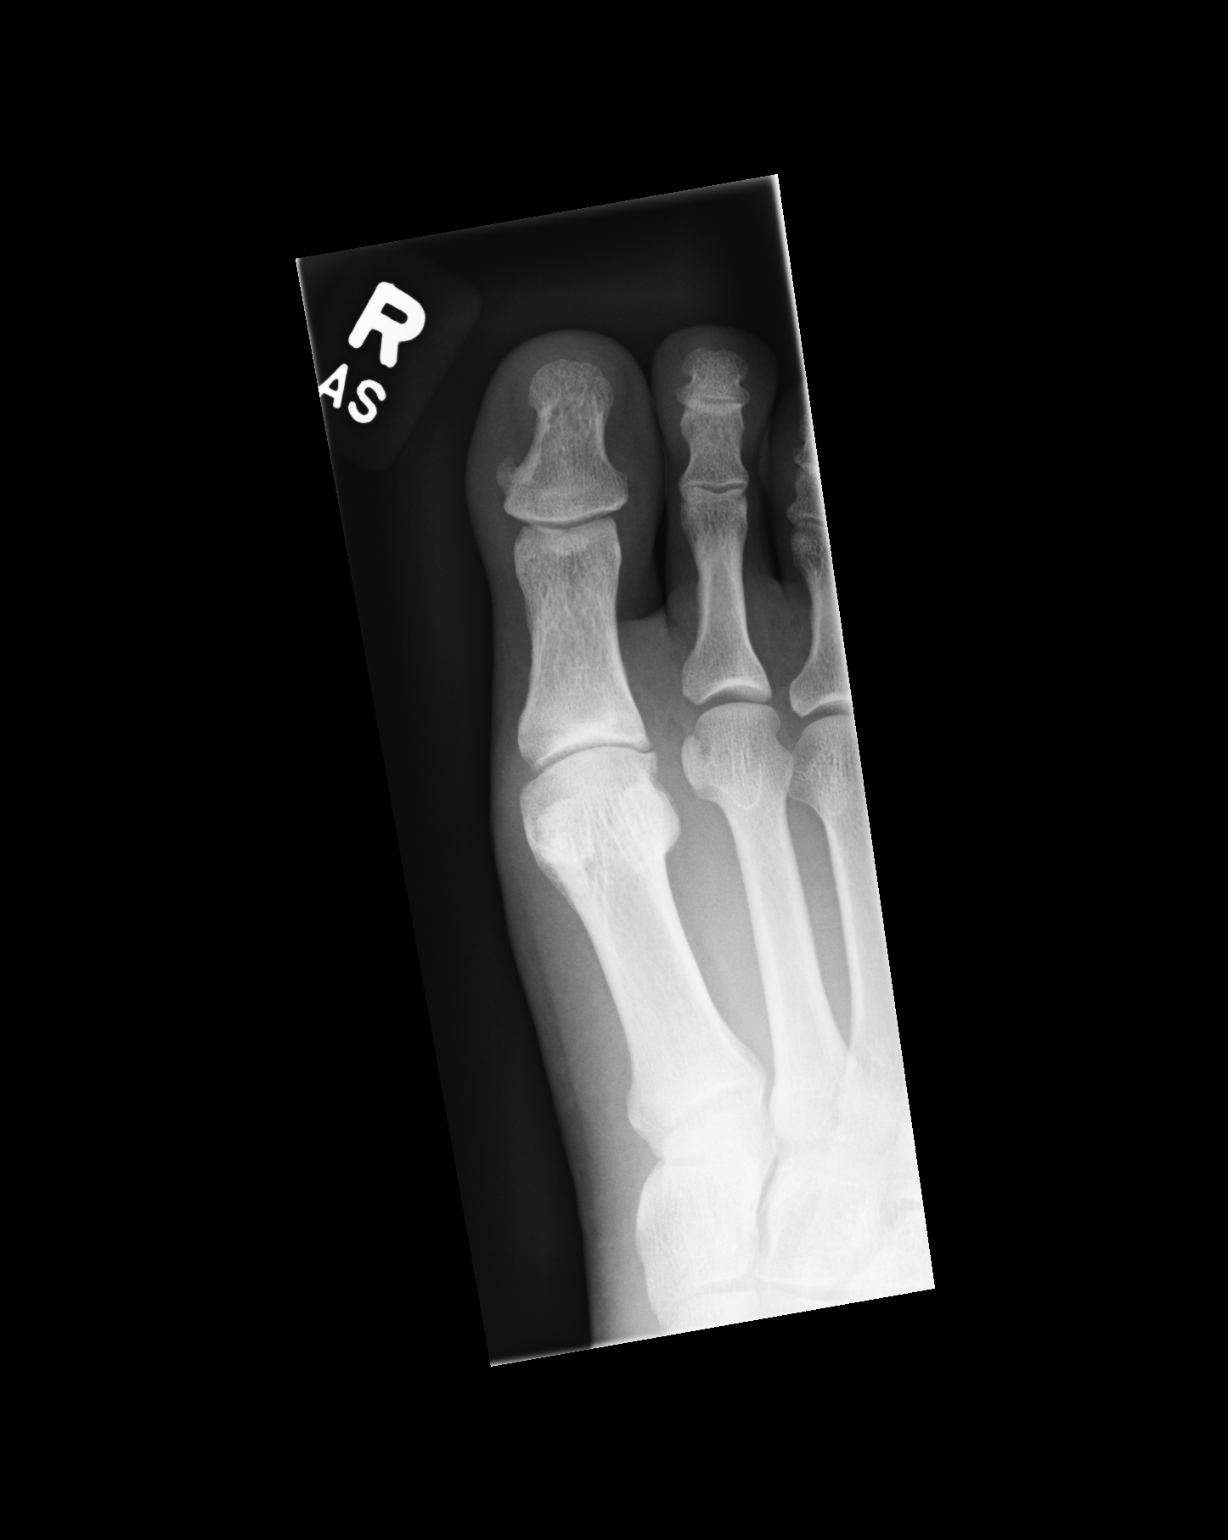

[dg toe great right (2 of 3)]
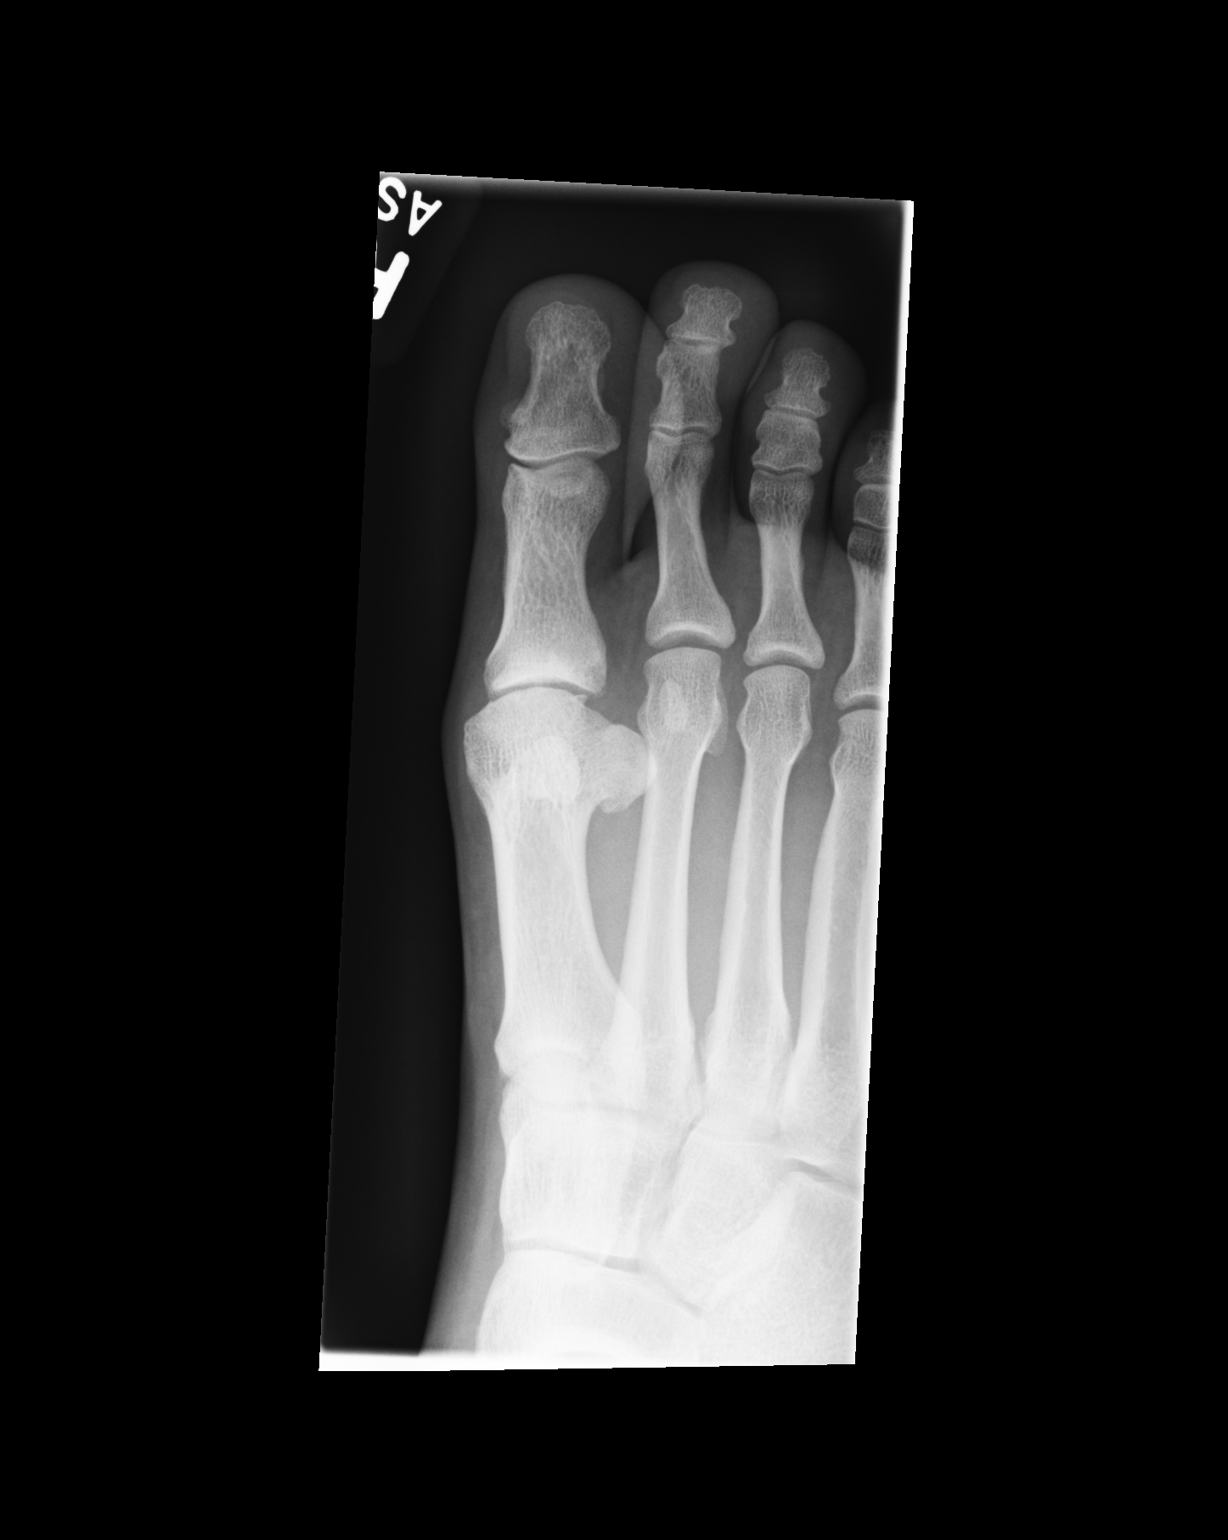

[dg toe great right (3 of 3)]
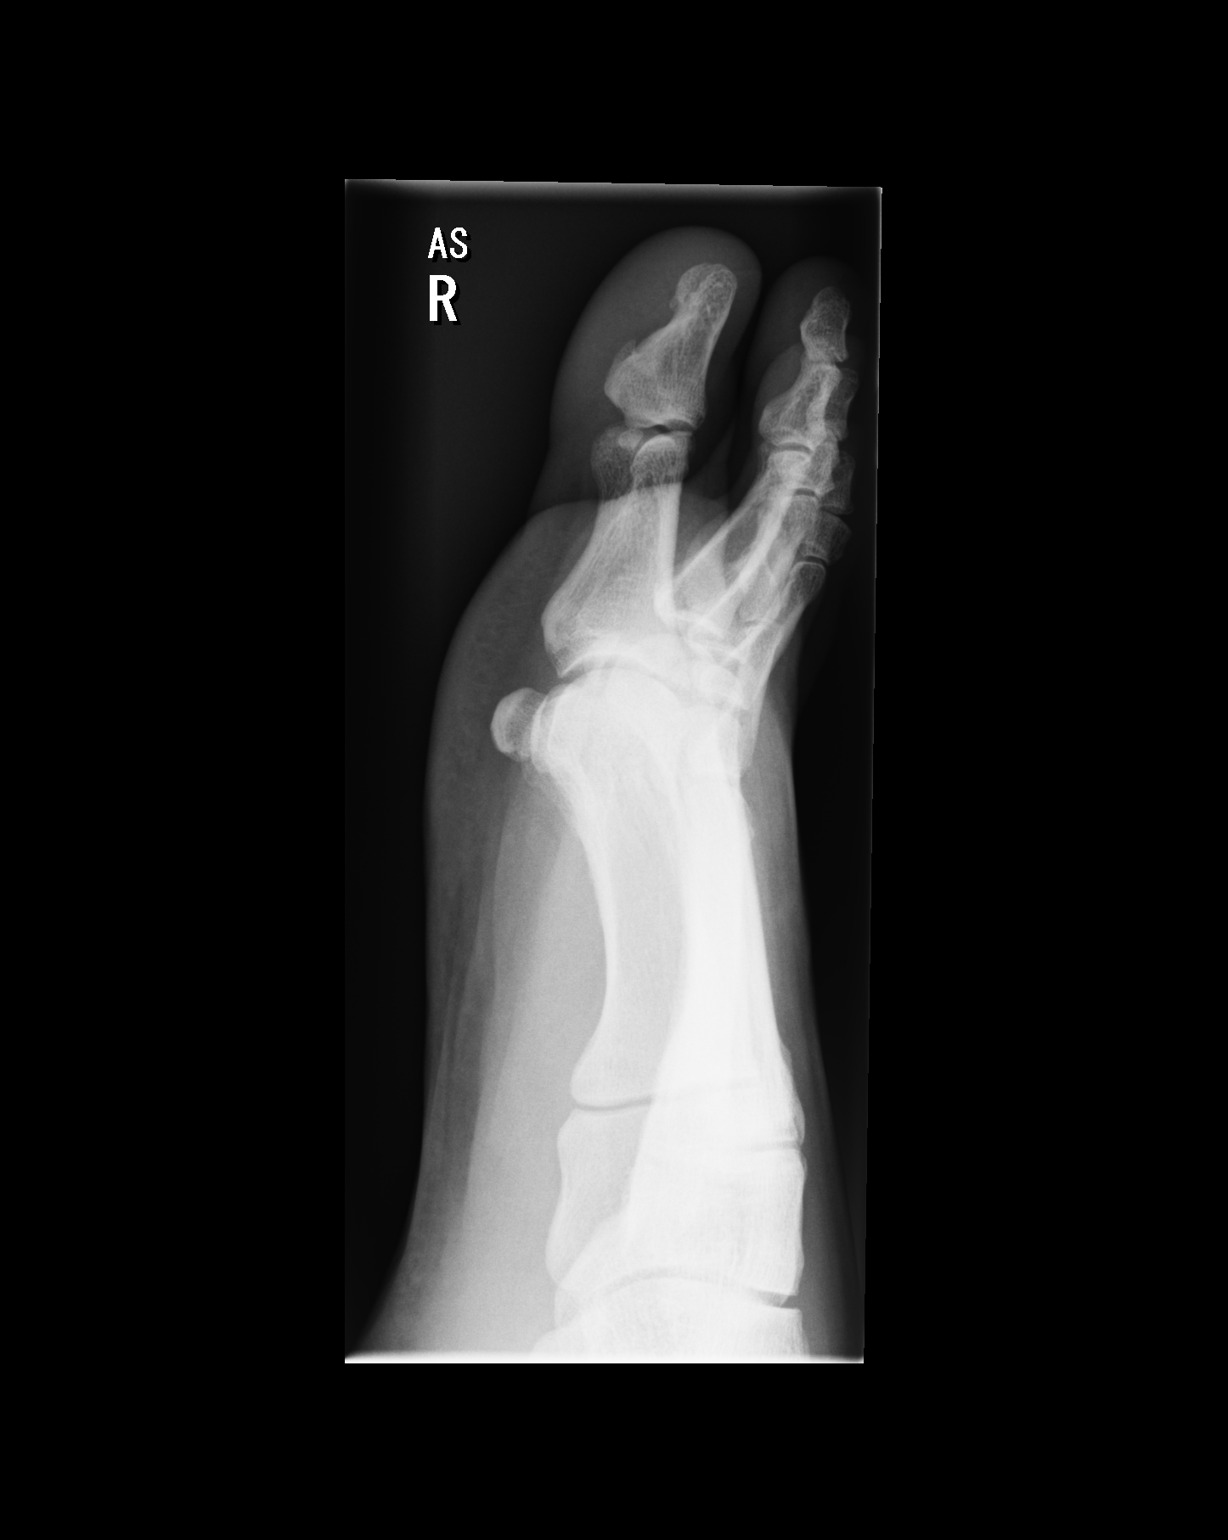

[3 of 3 positions shown; findings below may reference images not displayed]

FINDINGS: Mild lateral aspect of the great toe metatarsophalangeal joint space
narrowing and peripheral spurring. The great toe interphalangeal
joint space is maintained. No acute fracture or dislocation. No
cortical erosion.
IMPRESSION: Mild great toe metatarsophalangeal joint osteoarthritis.

## 2024-03-25 DIAGNOSIS — F419 Anxiety disorder, unspecified: Secondary | ICD-10-CM | POA: Diagnosis not present

## 2024-05-06 DIAGNOSIS — F419 Anxiety disorder, unspecified: Secondary | ICD-10-CM | POA: Diagnosis not present

## 2024-06-17 DIAGNOSIS — F419 Anxiety disorder, unspecified: Secondary | ICD-10-CM | POA: Diagnosis not present

## 2024-07-01 DIAGNOSIS — Z23 Encounter for immunization: Secondary | ICD-10-CM | POA: Diagnosis not present

## 2024-07-15 DIAGNOSIS — R7301 Impaired fasting glucose: Secondary | ICD-10-CM | POA: Diagnosis not present

## 2024-07-15 DIAGNOSIS — K760 Fatty (change of) liver, not elsewhere classified: Secondary | ICD-10-CM | POA: Diagnosis not present

## 2024-07-15 DIAGNOSIS — E7800 Pure hypercholesterolemia, unspecified: Secondary | ICD-10-CM | POA: Diagnosis not present

## 2024-07-17 DIAGNOSIS — M25511 Pain in right shoulder: Secondary | ICD-10-CM | POA: Diagnosis not present

## 2024-07-17 DIAGNOSIS — Z0000 Encounter for general adult medical examination without abnormal findings: Secondary | ICD-10-CM | POA: Diagnosis not present

## 2024-07-17 DIAGNOSIS — N509 Disorder of male genital organs, unspecified: Secondary | ICD-10-CM | POA: Diagnosis not present

## 2024-07-17 DIAGNOSIS — K760 Fatty (change of) liver, not elsewhere classified: Secondary | ICD-10-CM | POA: Diagnosis not present

## 2024-07-17 DIAGNOSIS — E7800 Pure hypercholesterolemia, unspecified: Secondary | ICD-10-CM | POA: Diagnosis not present

## 2024-07-22 DIAGNOSIS — F419 Anxiety disorder, unspecified: Secondary | ICD-10-CM | POA: Diagnosis not present

## 2024-08-05 DIAGNOSIS — Z1211 Encounter for screening for malignant neoplasm of colon: Secondary | ICD-10-CM | POA: Diagnosis not present

## 2024-09-02 DIAGNOSIS — F419 Anxiety disorder, unspecified: Secondary | ICD-10-CM | POA: Diagnosis not present

## 2024-09-08 DIAGNOSIS — Z1211 Encounter for screening for malignant neoplasm of colon: Secondary | ICD-10-CM | POA: Diagnosis not present

## 2024-09-08 DIAGNOSIS — K700 Alcoholic fatty liver: Secondary | ICD-10-CM | POA: Diagnosis not present

## 2024-09-08 DIAGNOSIS — R748 Abnormal levels of other serum enzymes: Secondary | ICD-10-CM | POA: Diagnosis not present

## 2024-09-08 DIAGNOSIS — R195 Other fecal abnormalities: Secondary | ICD-10-CM | POA: Diagnosis not present
